# Patient Record
Sex: Female | Born: 1985 | Race: White | Hispanic: No | Marital: Single | Smoking: Never smoker
Health system: Southern US, Community
[De-identification: ages and names within clinical notes are randomized; demographics above are authoritative.]

## PROBLEM LIST (undated history)

## (undated) DIAGNOSIS — R55 Syncope and collapse: Secondary | ICD-10-CM

## (undated) DIAGNOSIS — Z8619 Personal history of other infectious and parasitic diseases: Secondary | ICD-10-CM

## (undated) DIAGNOSIS — Q825 Congenital non-neoplastic nevus: Secondary | ICD-10-CM

## (undated) DIAGNOSIS — Z8679 Personal history of other diseases of the circulatory system: Secondary | ICD-10-CM

## (undated) HISTORY — DX: Congenital non-neoplastic nevus: Q82.5

## (undated) HISTORY — PX: OTHER SURGICAL HISTORY: SHX169

## (undated) HISTORY — DX: Personal history of other diseases of the circulatory system: Z86.79

## (undated) HISTORY — DX: Syncope and collapse: R55

## (undated) HISTORY — DX: Personal history of other infectious and parasitic diseases: Z86.19

---

## 2012-07-05 ENCOUNTER — Ambulatory Visit: Payer: Self-pay | Admitting: Orthopedic Surgery

## 2012-07-26 ENCOUNTER — Encounter: Payer: Self-pay | Admitting: Neurology

## 2012-07-26 ENCOUNTER — Ambulatory Visit (INDEPENDENT_AMBULATORY_CARE_PROVIDER_SITE_OTHER): Payer: 59 | Admitting: Neurology

## 2012-07-26 VITALS — BP 95/65 | HR 70 | Ht 63.0 in | Wt 137.0 lb

## 2012-07-26 DIAGNOSIS — R209 Unspecified disturbances of skin sensation: Secondary | ICD-10-CM

## 2012-07-26 DIAGNOSIS — R200 Anesthesia of skin: Secondary | ICD-10-CM | POA: Insufficient documentation

## 2012-07-26 DIAGNOSIS — M545 Low back pain, unspecified: Secondary | ICD-10-CM | POA: Insufficient documentation

## 2012-07-26 DIAGNOSIS — M25559 Pain in unspecified hip: Secondary | ICD-10-CM

## 2012-07-26 NOTE — Progress Notes (Signed)
Maureen Campbell is a 27 years old right-handed Caucasian female, alone at today's visit, she is referred by her primary care physician at [place] for evaluation of low back pain, bilateral hip pain  Her low back pain started 3 years ago, without clear trigger, she noticed bilateral SI joint pain, getting worse  with prolonged standing, she also noticed bilateral hip pain, radiating towards lateral part of her thighs and occasionally to her right calf,  Her hip, and thigh pain getting worse by bearing weight, prolonged walking, she denies any lower extremity weakness, no sensory changes, she denied bowel and bladder incontinence  Over the years, she was seen by different specialists, MRI of lumbar showed mild degenerative disc disease, x-ray of bilateral hip showed no significant bony abnormalities, she has tried physical therapy, chiropractor without help  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   Constitutional:   N/A Cardiovascular:  Swelling in her legs Ear/Nose/Throat:  N/A Skin: N/A Eyes: N/A Respiratory: Shortness of breath Gastroitestinal: N/A    Hematology/Lymphatic:  N/A Endocrine:  N/A Musculoskeletal: Joints pain, joint swelling, aching muscles. Allergy/Immunology: N/A Neurological: Headaches, numbness. Psychiatric:    N/A  PHYSICAL EXAMINATOINS:  Generalized: In no acute distress  Neck: Supple, no carotid bruits   Cardiac: Regular rate rhythm  Pulmonary: Clear to auscultation bilaterally  Musculoskeletal: No deformity  Neurological examination  Mentation: Alert oriented to time,  place, history taking, and causual conversation  Cranial nerve II-XII: Pupils were equal round reactive to light extraocular movements were full, visual field were full on confrontational test. facial sensation and strength were normal. 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 Uvula tongue midline.  head turning and shoulder shrug and were normal and symmetric.Tongue protrusion into cheek strength was normal.  Motor: normal tone, bulk and strength.  Sensory: Intact to fine touch, pinprick, preserved vibratory sensation, and proprioception at toes.  Coordination: Normal finger to nose, heel-to-shin bilaterally there was no truncal ataxia  Gait: Rising up from seated position without assistance, normal stance, without trunk ataxia, moderate stride, good arm swing, smooth turning, able to perform tiptoe, and heel walking without difficulty.   Romberg signs: Negative  Deep tendon reflexes: Brachioradialis 2/2, biceps 2/2, triceps 2/2, patellar 2/2, Achilles 2/2, plantar responses were flexor bilaterally.  Assessment and plan:  27 years old right-handed Caucasian female, with several years history of low back pain, bilateral hip pain, most consistent with musculoskeletal etiology, normal neurological examination,  1 I have suggested her back stretching exercise, hot compression,  2, after discussion, we decided not to proceed with more imaging study, she will call office for worsening symptoms,  3. She may take Tylenol, NSAIDs as needed

## 2014-03-22 IMAGING — CR DG LUMBAR SPINE 2-3V
1 series · 3 of 3 positions shown · non-contrast
Comparison: none

REASON FOR EXAM: back pain, STANDING
COMMENTS:

PROCEDURE:     DXR - DXR LUMBAR SPINE AP AND LATERAL  - July 05, 2012 [DATE]
RESULT:     Lumbar spine images shows the vertebral body heights and
intervertebral disc spaces appear to be maintained. There is no subluxation.
There is no bony destruction or fracture.

[Series 1: w lumbar spine ap · 0.14mm/px · 3 of 3 slices shown]
[im 1/3]
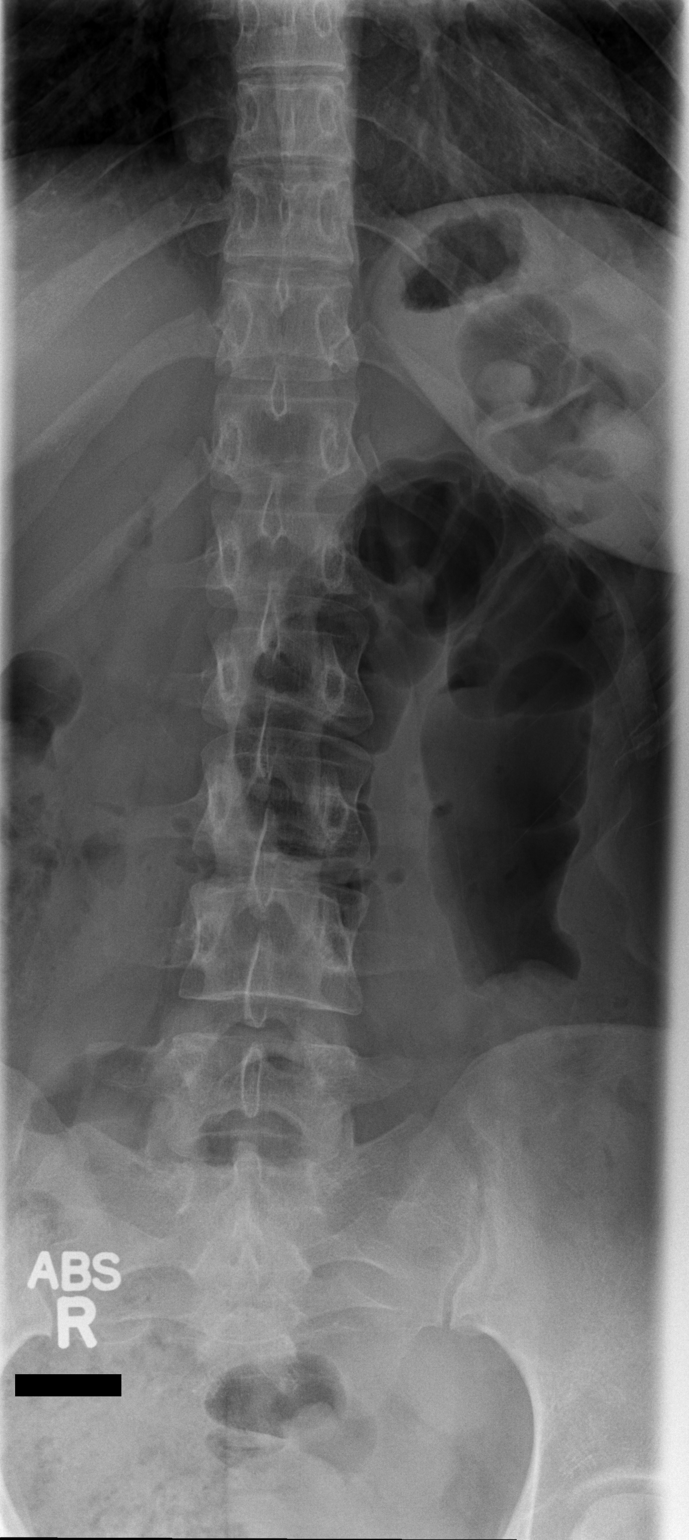
[im 2/3]
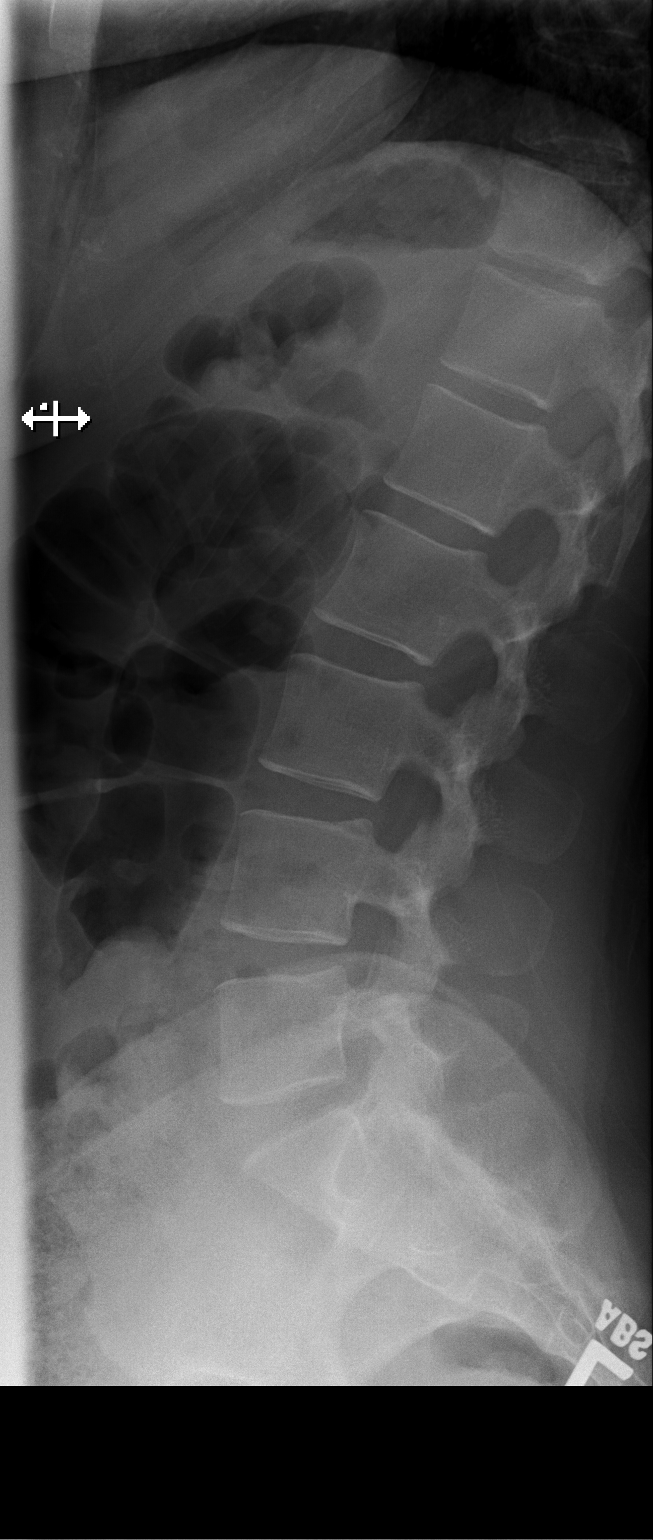
[im 3/3]
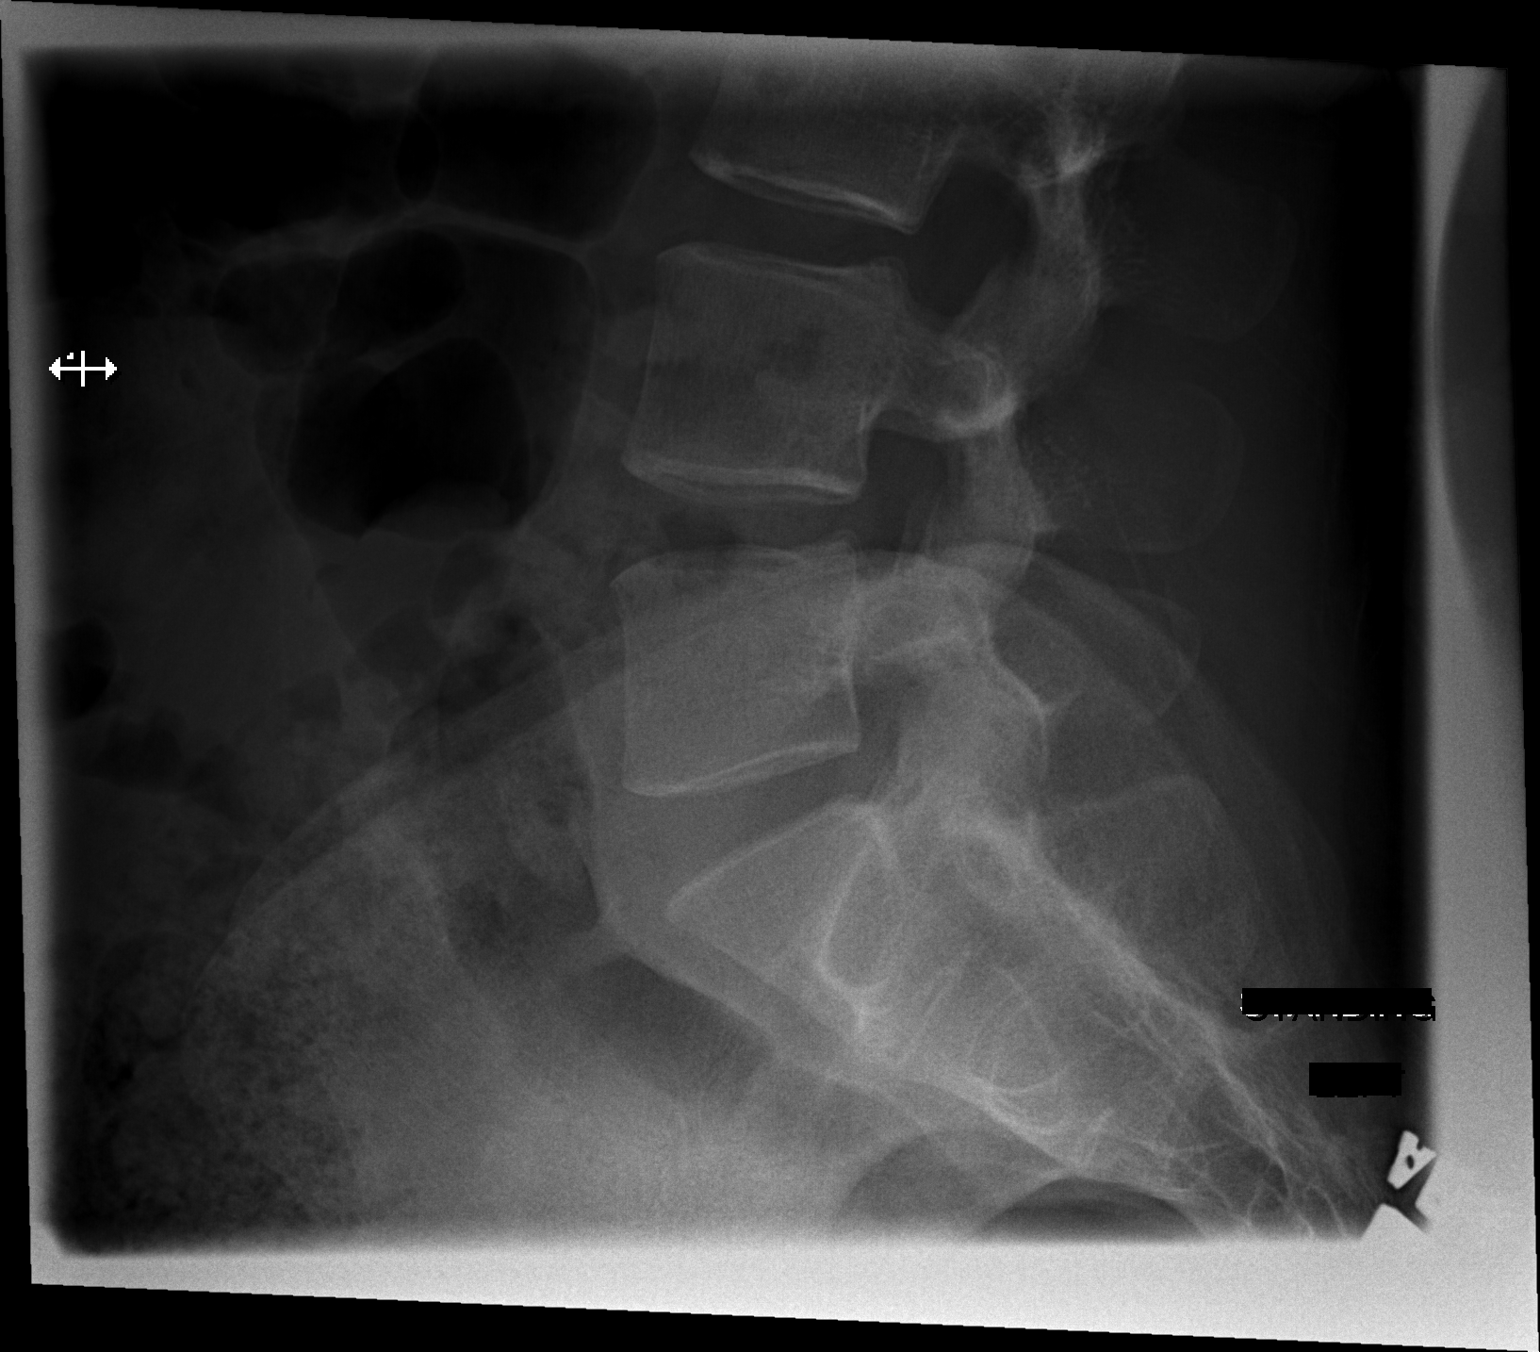

[3 of 3 positions shown; findings below may reference images not displayed]

IMPRESSION: 1. No acute bony abnormality of the lumbar spine. No significant
degenerative change.

[REDACTED]

## 2015-08-06 ENCOUNTER — Ambulatory Visit (INDEPENDENT_AMBULATORY_CARE_PROVIDER_SITE_OTHER): Payer: BLUE CROSS/BLUE SHIELD | Admitting: Internal Medicine

## 2015-08-06 ENCOUNTER — Encounter: Payer: Self-pay | Admitting: *Deleted

## 2015-08-06 ENCOUNTER — Encounter: Payer: Self-pay | Admitting: Internal Medicine

## 2015-08-06 VITALS — BP 108/70 | HR 82 | Temp 98.9°F | Ht 63.0 in | Wt 145.2 lb

## 2015-08-06 DIAGNOSIS — R1011 Right upper quadrant pain: Secondary | ICD-10-CM

## 2015-08-06 NOTE — Progress Notes (Signed)
Pre visit review using our clinic review tool, if applicable. No additional management support is needed unless otherwise documented below in the visit note. 

## 2015-08-06 NOTE — Progress Notes (Signed)
HPI  Pt presents to the clinic today to establish care. She has not had a PCP in many years.  She is concerned about right upper quadrant pain. This started 2 weeks ago, after she walked into a table while at work. There was no bruising, but it was very painful. The pain diminished, but returned 2 days ago and is more severe. She describes the pain as sore and achy. She denies nausea, vomiting, diarrhea, constipation or blood in her stool. She has not tried anything OTC.  Flu: 2014 Tetanus: < 5 years ago Dentist: as needed  Past Medical History  Diagnosis Date  . History of chickenpox   . History of headache   . History of cardiac murmur     No current outpatient prescriptions on file.   No current facility-administered medications for this visit.    No Known Allergies  Family History  Problem Relation Age of Onset  . Diabetes Father   . Thyroid disease Mother   . Arthritis Paternal Grandmother   . Heart disease Paternal Grandfather   . Stroke Paternal Grandfather   . Hypertension Paternal Grandmother   . Diabetes Paternal Grandfather     Social History   Social History  . Marital Status: Single    Spouse Name: Eliberto Ivory  . Number of Children: o  . Years of Education: college   Occupational History  .  [employer]   Social History Main Topics  . Smoking status: Never Smoker   . Smokeless tobacco: Never Used  . Alcohol Use: No  . Drug Use: No  . Sexual Activity: Not on file   Other Topics Concern  . Not on file   Social History Narrative   She lives with her husband, who recently moved from [place] and [place], she denies smoke, no drinking, she works at [employer], no children    ROS:  Constitutional: Denies fever, malaise, fatigue, headache or abrupt weight changes.  Respiratory: Denies difficulty breathing, shortness of breath, cough or sputum production.   Cardiovascular: Denies chest pain, chest tightness, palpitations or swelling in the hands  or feet.  Gastrointestinal: Pt reports RUQ pain. Denies bloating, constipation, diarrhea or blood in the stool.  GU: Denies frequency, urgency, pain with urination, blood in urine, odor or discharge. Musculoskeletal: Denies decrease in range of motion, difficulty with gait, muscle pain or joint pain and swelling.  Skin: Denies redness, rashes, lesions or ulcercations.    No other specific complaints in a complete review of systems (except as listed in HPI above).  PE:  BP 108/70 mmHg  Pulse 82  Temp(Src) 98.9 F (37.2 C) (Oral)  Ht  (1.6 m)  Wt 145 lb 4 oz (65.885 kg)  BMI 25.74 kg/m2  SpO2 97%  LMP 08/06/2015 Wt Readings from Last 3 Encounters:  08/06/15 145 lb 4 oz (65.885 kg)  07/26/12 137 lb (62.143 kg)    General: Appears her stated age, in NAD. Cardiovascular: Normal rate and rhythm. S1,S2 noted.  No murmur, rubs or gallops noted.  Pulmonary/Chest: Normal effort and positive vesicular breath sounds. No respiratory distress. No wheezes, rales or ronchi noted.  Abdomen: Soft and nontender. Normal bowel sounds. No distention or masses noted. Liver, spleen and kidneys non palpable. Musculoskeletal: Tender to palpation over the right lower ribs anterolaterally. Neurological: Alert and oriented.  Psychiatric: Mood and affect normal. Behavior is normal. Judgment and thought content normal.    Assessment and Plan:  RUQ pain:  Likely rib fracture, given recent  trauma No indication for xray of ribs at this time Ibuprofen and heat Will check CBC and CMET today just to make sure no injury to liver  Will follow up after labs, make an appt for an annual exam

## 2015-08-06 NOTE — Patient Instructions (Signed)

## 2015-08-07 LAB — CBC
HCT: 38.8 % (ref 36.0–46.0)
Hemoglobin: 13 g/dL (ref 12.0–15.0)
MCHC: 33.4 g/dL (ref 30.0–36.0)
MCV: 85.8 fl (ref 78.0–100.0)
PLATELETS: 241 10*3/uL (ref 150.0–400.0)
RBC: 4.53 Mil/uL (ref 3.87–5.11)
RDW: 13.5 % (ref 11.5–15.5)
WBC: 9.3 10*3/uL (ref 4.0–10.5)

## 2015-08-07 LAB — COMPREHENSIVE METABOLIC PANEL
ALT: 13 U/L (ref 0–35)
AST: 16 U/L (ref 0–37)
Albumin: 4.3 g/dL (ref 3.5–5.2)
Alkaline Phosphatase: 68 U/L (ref 39–117)
BUN: 12 mg/dL (ref 6–23)
CHLORIDE: 102 meq/L (ref 96–112)
CO2: 32 mEq/L (ref 19–32)
CREATININE: 0.68 mg/dL (ref 0.40–1.20)
Calcium: 9.6 mg/dL (ref 8.4–10.5)
GFR: 109.4 mL/min (ref >60.00)
GLUCOSE: 99 mg/dL (ref 70–99)
Potassium: 3.7 mEq/L (ref 3.5–5.1)
SODIUM: 139 meq/L (ref 135–145)
Total Bilirubin: 0.4 mg/dL (ref 0.2–1.2)
Total Protein: 7.4 g/dL (ref 6.0–8.3)

## 2015-08-13 ENCOUNTER — Telehealth: Payer: Self-pay | Admitting: Internal Medicine

## 2015-08-13 NOTE — Telephone Encounter (Signed)
Patient Name: Renee HarderKAYLIN Campau  DOB: 1985/07/17    Initial Comment Caller states she was seen last week and they thought she had a broken rib. The pain has moved to her back and is getting worse.    Nurse Assessment  Nurse: Stefano GaulStringer, RN, Dwana CurdVera Date/Time (Eastern Time): 08/13/2015 1:56:58 PM  Confirm and document reason for call. If symptomatic, describe symptoms. You must click the next button to save text entered. ---Caller states she was seen last week at the office and they thought she had a broken rib. She works in [employer] and slid. Pain is radiating to her back. Pain is getting worse. She is taking OTC ibuprofen and using heat. Pain level 6-8. Pain is bringing her to tears.  Has the patient traveled out of the country within the last 30 days? ---Not Applicable  Does the patient have any new or worsening symptoms? ---Yes  Will a triage be completed? ---Yes  Related visit to physician within the last 2 weeks? ---Yes  Does the PT have any chronic conditions? (i.e. diabetes, asthma, etc.) ---Yes  List chronic conditions. ---herniated disc  Is the patient pregnant or possibly pregnant? (Ask all females between the ages of 8912-55) ---No  Is this a behavioral health or substance abuse call? ---No     Guidelines    Guideline Title Affirmed Question Affirmed Notes  Flank Pain [1] SEVERE pain (e.g., excruciating, scale 8-10) AND [2] present > 1 hour    Final Disposition User   Go to ED Now Stefano GaulStringer, RN, Dwana CurdVera    Comments  Pt states she does not want to go to the ER due to medical bills she already owes but wants to come to the office to be seen.  [facility] and [facility] and advised her that pt was seen last week by Nicki Reaperegina Baity and was diagnosed with a broken rib and was advised to use OTC ibuprofen and heat. Pain has gotten worse with pain level of 8 and she was advised to go to the ER but states she has too many medical bills and does not want to go to the ER but would prefer to come  to the office.  Please call her on secondary number if primary number is not answered.   Referrals  GO TO FACILITY REFUSED   Disagree/Comply: Disagree  Disagree/Comply Reason: Disagree with instructions

## 2015-08-13 NOTE — Telephone Encounter (Signed)
Noted and agree. Will likely need chest xray.

## 2015-08-13 NOTE — Telephone Encounter (Signed)
I called pt and she is not hurting as bad as when she spoke with TH; No SOB and no difficulty breathing. [facility]; spoke with Almira CoasterGina RN team lead and advised to schedule appt at our office but if pt condition changes or worsens prior to appt pt will go to ED. Pt voiced understanding. Pt scheduled appt with Mayra ReelKate Clark NP on 08/14/15 at 1:15.

## 2015-08-14 ENCOUNTER — Ambulatory Visit (INDEPENDENT_AMBULATORY_CARE_PROVIDER_SITE_OTHER): Payer: BLUE CROSS/BLUE SHIELD | Admitting: Primary Care

## 2015-08-14 ENCOUNTER — Ambulatory Visit: Payer: BLUE CROSS/BLUE SHIELD | Admitting: Primary Care

## 2015-08-14 ENCOUNTER — Ambulatory Visit
Admission: RE | Admit: 2015-08-14 | Discharge: 2015-08-14 | Disposition: A | Discharge status: Home / Self Care | Payer: BLUE CROSS/BLUE SHIELD | Source: Ambulatory Visit | Attending: Primary Care | Admitting: Primary Care

## 2015-08-14 ENCOUNTER — Ambulatory Visit (INDEPENDENT_AMBULATORY_CARE_PROVIDER_SITE_OTHER)
Admission: RE | Admit: 2015-08-14 | Discharge: 2015-08-14 | Disposition: A | Discharge status: Home / Self Care | Payer: BLUE CROSS/BLUE SHIELD | Source: Ambulatory Visit | Attending: Primary Care | Admitting: Primary Care

## 2015-08-14 ENCOUNTER — Encounter: Payer: Self-pay | Admitting: Primary Care

## 2015-08-14 VITALS — BP 110/68 | HR 71 | Temp 98.2°F | Ht 63.0 in | Wt 146.8 lb

## 2015-08-14 DIAGNOSIS — R0781 Pleurodynia: Secondary | ICD-10-CM

## 2015-08-14 MED ORDER — NAPROXEN 500 MG PO TABS: 500.0000 mg | ORAL_TABLET | Freq: Two times a day (BID) | ORAL | Status: AC | PRN

## 2015-08-14 NOTE — Addendum Note (Signed)
Addended by: Alvina ChouWALSH, TERRI J on: 08/14/2015 04:23 PM   Modules accepted: Orders

## 2015-08-14 NOTE — Patient Instructions (Addendum)
Start Naproxen tablets for pain. Take 1 tablet by mouth twice daily as needed for moderate-severe pain.  If your rib is cracked, it may take several months to heal completely. You should notice a gradual improvement as time passes.  Ensure you are completing deep breathing exercises to keep your lungs inflated.  Complete xray(s) prior to leaving today. I will notify you of your results once received.  Please don't hesitate to call us with any questions.  It was a pleasure meeting you!  Rib Fracture A rib fracture is a break or crack in one of the bones of the ribs. The ribs are a group of long, curved bones that wrap around your chest and attach to your spine. They protect your lungs and other organs in the chest cavity. A broken or cracked rib is often painful, but most do not cause other problems. Most rib fractures heal on their own over time. However, rib fractures can be more serious if multiple ribs are broken or if broken ribs move out of place and push against other structures. CAUSES   A direct blow to the chest. For example, this could happen during contact sports, a car accident, or a fall against a hard object.  Repetitive movements with high force, such as pitching a baseball or having severe coughing spells. SYMPTOMS   Pain when you breathe in or cough.  Pain when someone presses on the injured area. DIAGNOSIS  Your caregiver will perform a physical exam. Various imaging tests may be ordered to confirm the diagnosis and to look for related injuries. These tests may include a chest X-ray, computed tomography (CT), magnetic resonance imaging (MRI), or a bone scan. TREATMENT  Rib fractures usually heal on their own in 1-3 months. The longer healing period is often associated with a continued cough or other aggravating activities. During the healing period, pain control is very important. Medication is usually given to control pain. Hospitalization or surgery may be needed for  more severe injuries, such as those in which multiple ribs are broken or the ribs have moved out of place.  HOME CARE INSTRUCTIONS   Avoid strenuous activity and any activities or movements that cause pain. Be careful during activities and avoid bumping the injured rib.  Gradually increase activity as directed by your caregiver.  Only take over-the-counter or prescription medications as directed by your caregiver. Do not take other medications without asking your caregiver first.  Apply ice to the injured area for the first 1-2 days after you have been treated or as directed by your caregiver. Applying ice helps to reduce inflammation and pain.  Put ice in a plastic bag.  Place a towel between your skin and the bag.   Leave the ice on for 15-20 minutes at a time, every 2 hours while you are awake.  Perform deep breathing as directed by your caregiver. This will help prevent pneumonia, which is a common complication of a broken rib. Your caregiver may instruct you to:  Take deep breaths several times a day.  Try to cough several times a day, holding a pillow against the injured area.  Use a device called an incentive spirometer to practice deep breathing several times a day.  Drink enough fluids to keep your urine clear or pale yellow. This will help you avoid constipation.   Do not wear a rib belt or binder. These restrict breathing, which can lead to pneumonia.  SEEK IMMEDIATE MEDICAL CARE IF:   You have a  fever.   You have difficulty breathing or shortness of breath.   You develop a continual cough, or you cough up thick or bloody sputum.  You feel sick to your stomach (nausea), throw up (vomit), or have abdominal pain.   You have worsening pain not controlled with medications.  MAKE SURE YOU:  Understand these instructions.  Will watch your condition.  Will get help right away if you are not doing well or get worse.   This information is not intended to  replace advice given to you by your health care provider. Make sure you discuss any questions you have with your health care provider.   Document Released: 04/12/2005 Document Revised: 12/13/2012 Document Reviewed: 06/14/2012 Elsevier Interactive Patient Education [employer]

## 2015-08-14 NOTE — Progress Notes (Signed)
Pre visit review using our clinic review tool, if applicable. No additional management support is needed unless otherwise documented below in the visit note. 

## 2015-08-14 NOTE — Progress Notes (Signed)
Subjective:    Patient ID: Maureen Campbell, female    DOB: 1985/05/27, 30 y.o.   MRN: [id number]  HPI  [name] is a 30 year old female who presents today with a chief complaint of rib pain. Her pain is located to the right lateral chest wall with radiation of pain to her right flank. About 3 weeks ago she accidentally hit her right side on a table at work (works at [employer]).   Overall her pain is improved, but yesterday she experienced a moderate/severe amount of pain during work while ambulating.  Denies abdominal pain, urinary frequency, difficulty urinating, dysuria, vomiting, shortness of breath. She's been taking ibuprofen 600 mg for the past 2 days with improvement yesterday, but not today. She's rested 1 day since the incident and felt better after resting.  Review of Systems  Respiratory: Negative for shortness of breath.   Cardiovascular: Negative for chest pain.  Gastrointestinal: Negative for abdominal pain.  Genitourinary: Negative for dysuria, frequency and difficulty urinating.  Musculoskeletal:       Right rib pain       Past Medical History  Diagnosis Date  . History of chickenpox   . History of cardiac murmur   . 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 Vasomotor instability      Social History   Social History  . Marital Status: Single    Spouse Name: Eliberto Ivory  . Number of Children: o  . Years of Education: college   Occupational History  .  [employer]   Social History Main Topics  . Smoking status: Never Smoker   . Smokeless tobacco: Never Used  . Alcohol Use: No  . Drug Use: No  . Sexual Activity: Yes   Other Topics Concern  . Not on file   Social History Narrative   She lives with her husband, who recently moved from [place] and [place], she denies smoke, no drinking, she works at [employer], no children    Past Surgical History  Procedure Laterality Date  . Laser birth mark      age 33    Family  History  Problem Relation Age of Onset  . Diabetes Father   . Thyroid disease Mother   . Arthritis Paternal Grandmother   . Heart disease Paternal Grandfather   . Stroke Paternal Grandfather   . Hypertension Paternal Grandmother   . Diabetes Paternal Grandfather     No Known Allergies  No current outpatient prescriptions on file prior to visit.   No current facility-administered medications on file prior to visit.    BP 110/68 mmHg  Pulse 71  Temp(Src) 98.2 F (36.8 C) (Oral)  Ht  (1.6 m)  Wt 146 lb 12.8 oz (66.588 kg)  BMI 26.01 kg/m2  SpO2 97%  LMP 08/06/2015    Objective:   Physical Exam  Constitutional: She appears well-nourished.  Cardiovascular: Normal rate and regular rhythm.   Pulmonary/Chest: Effort normal and breath sounds normal.  Full expansion of lungs bilaterally. No evidence of pneumothorax.  Musculoskeletal:  Moderate tenderness to right lateral chest wall at about T8. No crepitus.   Skin: Skin is warm and dry. No erythema.  No bruising.          Assessment & Plan:  Rib Pain:  Located to the right lateral chest wall since injury at work 3 weeks ago. Moderately tender upon exam at T8 level. No evidence of pneumothorax. Suspect  fracture/crack to right rib. Will obtain xray today to rule in/out. Discussed if fracture that it takes 1-3 months for complete healing. RX for Naproxen sent to pharmacy PRN pain. Discussed importance of deep breathing exercises. Return precautions provided.

## 2016-02-09 ENCOUNTER — Encounter: Payer: Self-pay | Admitting: Family Medicine

## 2016-02-09 ENCOUNTER — Ambulatory Visit (INDEPENDENT_AMBULATORY_CARE_PROVIDER_SITE_OTHER): Payer: BLUE CROSS/BLUE SHIELD | Admitting: Internal Medicine

## 2016-02-09 VITALS — BP 102/74 | HR 73 | Temp 98.6°F | Ht 63.0 in | Wt 143.5 lb

## 2016-02-09 DIAGNOSIS — R3915 Urgency of urination: Secondary | ICD-10-CM

## 2016-02-09 LAB — POC URINALSYSI DIPSTICK (AUTOMATED)
Blood, UA: NEGATIVE
GLUCOSE UA: NEGATIVE
Ketones, UA: NEGATIVE
LEUKOCYTES UA: NEGATIVE
NITRITE UA: POSITIVE
PH UA: 6
Protein, UA: NEGATIVE
Spec Grav, UA: >=1.03
Urobilinogen, UA: 1

## 2016-02-09 LAB — POCT URINE PREGNANCY: Preg Test, Ur: NEGATIVE

## 2016-02-09 NOTE — Progress Notes (Signed)
HPI  Pt presents to the clinic today with c/o urinary frequency. She reports this started 2 days ago. She denies urgency, dysuria or blood in her urine. She denies fever, chills, nausea or low back pain. She denies vaginal complaints. She has taken AZO OTC with some relief. She reports she has been drinking more sodas than usual.   Review of Systems  Past Medical History:  Diagnosis Date  . 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 History of cardiac murmur   . History of chickenpox   . Vasomotor instability     Family History  Problem Relation Age of Onset  . Diabetes Father   . Thyroid disease Mother   . Arthritis Paternal Grandmother   . Heart disease Paternal Grandfather   . Stroke Paternal Grandfather   . Hypertension Paternal Grandmother   . Diabetes Paternal Grandfather     Social History   Social History  . Marital status: Single    Spouse name: Eliberto Ivoryustin  . Number of children: o  . Years of education: college   Occupational History  .  [employer]   Social History Main Topics  . Smoking status: Never Smoker  . Smokeless tobacco: Never Used  . Alcohol use No  . Drug use: No  . Sexual activity: Yes   Other Topics Concern  . Not on file   Social History Narrative   She lives [place] her [place], [place], she denies smoke, no drinking, she works at [employer], no children    No Known Allergies  Constitutional: Denies fever, malaise, fatigue, headache or abrupt weight changes.   GU: Pt reports urgency. Denies frequency, dysuria, burning sensation, blood in urine, odor or discharge. Skin: Denies redness, rashes, lesions or ulcercations.   No other specific complaints in a complete review of systems (except as listed in HPI above).    Objective:   Physical Exam  BP 102/74   Pulse 73   Temp 98.6 F (37 C) (Oral)   Ht 5\' 3"  (1.6 m)   Wt 143 lb 8 oz (65.1 kg)   LMP 02/03/2016   SpO2 98%    BMI 25.42 kg/m   Wt Readings from Last 3 Encounters:  02/09/16 143 lb 8 oz (65.1 kg)  08/14/15 146 lb 12.8 oz (66.6 kg)  08/06/15 145 lb 4 oz (65.9 kg)    General: Appears her stated age, well developed, well nourished in NAD. Cardiovascular: Normal rate and rhythm. S1,S2 noted.   Pulmonary/Chest: Normal effort and positive vesicular breath sounds. No respiratory distress. No wheezes, rales or ronchi noted.  Abdomen: Soft and nontender. Normal bowel sounds. No distention or masses noted.  No CVA tenderness.      Assessment & Plan:   Urgency secondary to cystitis:  Urinalysis: AZO interferred Urine Hcg: negative Will send urine culture- will treat if culture positive OK to take AZO OTC Drink plenty of fluids  RTC as needed or if symptoms persist. Nicki ReaperBAITY, REGINA, NP

## 2016-02-09 NOTE — Patient Instructions (Signed)
Interstitial Cystitis Interstitial cystitis is a condition that causes inflammation of the bladder. The bladder is a hollow organ in the lower part of your abdomen. It stores urine after the urine is made by your kidneys. With interstitial cystitis, you may have pain in the bladder area. You may also have a frequent and urgent need to urinate. The severity of interstitial cystitis can vary from person to person. You may have flare-ups of the condition, and then it may go away for a while. For many people who have this condition, it becomes a long-term problem. CAUSES The cause of this condition is not known. RISK FACTORS This condition is more likely to develop in women. SYMPTOMS Symptoms of interstitial cystitis vary, and they can change over time. Symptoms may include:  Discomfort or pain in the bladder area. This can range from mild to severe. The pain may change in intensity as the bladder fills with urine or as it empties.  Pelvic pain.  An urgent need to urinate.  Frequent urination.  Pain during sexual intercourse.  Pinpoint bleeding on the bladder wall. For women, the symptoms often get worse during menstruation. DIAGNOSIS This condition is diagnosed by evaluating your symptoms and ruling out other causes. A physical exam will be done. Various tests may be done to rule out other conditions. Common tests include:  Urine tests.  Cystoscopy. In this test, a tool that is like a very thin telescope is used to look into your bladder.  Biopsy. This involves taking a sample of tissue from the bladder wall to be examined under a microscope. TREATMENT There is no cure for interstitial cystitis, but treatment methods are available to control your symptoms. Work closely with your health care provider to find the treatments that will be most effective for you. Treatment options may include:  Medicines to relieve pain and to help reduce the number of times that you feel the need to  urinate.  Bladder training. This involves learning ways to control when you urinate, such as:  Urinating at scheduled times.  Training yourself to delay urination.  Doing exercises (Kegel exercises) to strengthen the muscles that control urine flow.  Lifestyle changes, such as changing your diet or taking steps to control stress.  Use of a device that provides electrical stimulation in order to reduce pain.  A procedure that stretches your bladder by filling it with air or fluid.  Surgery. This is rare. It is only done for extreme cases if other treatments do not help. HOME CARE INSTRUCTIONS  Take medicines only as directed by your health care provider.  Use bladder training techniques as directed.  Keep a bladder diary to find out which foods, liquids, or activities make your symptoms worse.  Use your bladder diary to schedule bathroom trips. If you are away from home, plan to be near a bathroom at each of your scheduled times.  Make sure you urinate just before you leave the house and just before you go to bed.  Do Kegel exercises as directed by your health care provider.  Do not drink alcohol.  Do not use any tobacco products, including cigarettes, chewing tobacco, or electronic cigarettes. If you need help quitting, ask your health care provider.  Make dietary changes as directed by your health care provider. You may need to avoid spicy foods and foods that contain a high amount of potassium.  Limit your drinking of beverages that stimulate urination. These include soda, coffee, and tea.  Keep all follow-up   visits as directed by your health care provider. This is important. SEEK MEDICAL CARE IF:  Your symptoms do not get better after treatment.  Your pain and discomfort are getting worse.  You have more frequent urges to urinate.  You have a fever. SEEK IMMEDIATE MEDICAL CARE IF:  You are not able to control your bladder at all.   This information is not  intended to replace advice given to you by your health care provider. Make sure you discuss any questions you have with your health care provider.   Document Released: 12/12/2003 Document Revised: 05/03/2014 Document Reviewed: 12/18/2013 [employer] Interactive Patient Education 2016 [employer]  

## 2016-02-09 NOTE — Addendum Note (Signed)
Addended by: Roena MaladyEVONTENNO, Janautica Netzley Y on: 02/09/2016 03:58 PM   Modules accepted: Orders

## 2016-02-09 NOTE — Progress Notes (Signed)
Pre visit review using our clinic review tool, if applicable. No additional management support is needed unless otherwise documented below in the visit note. 

## 2016-02-11 LAB — URINE CULTURE: ORGANISM ID, BACTERIA: NO GROWTH

## 2017-04-30 IMAGING — DX DG CHEST 1V
1 series · 1 of 1 positions shown · non-contrast
Comparison: None.

CLINICAL DATA: Pain following trauma 3 weeks prior

EXAM:
CHEST 1 VIEW

[rib obl]
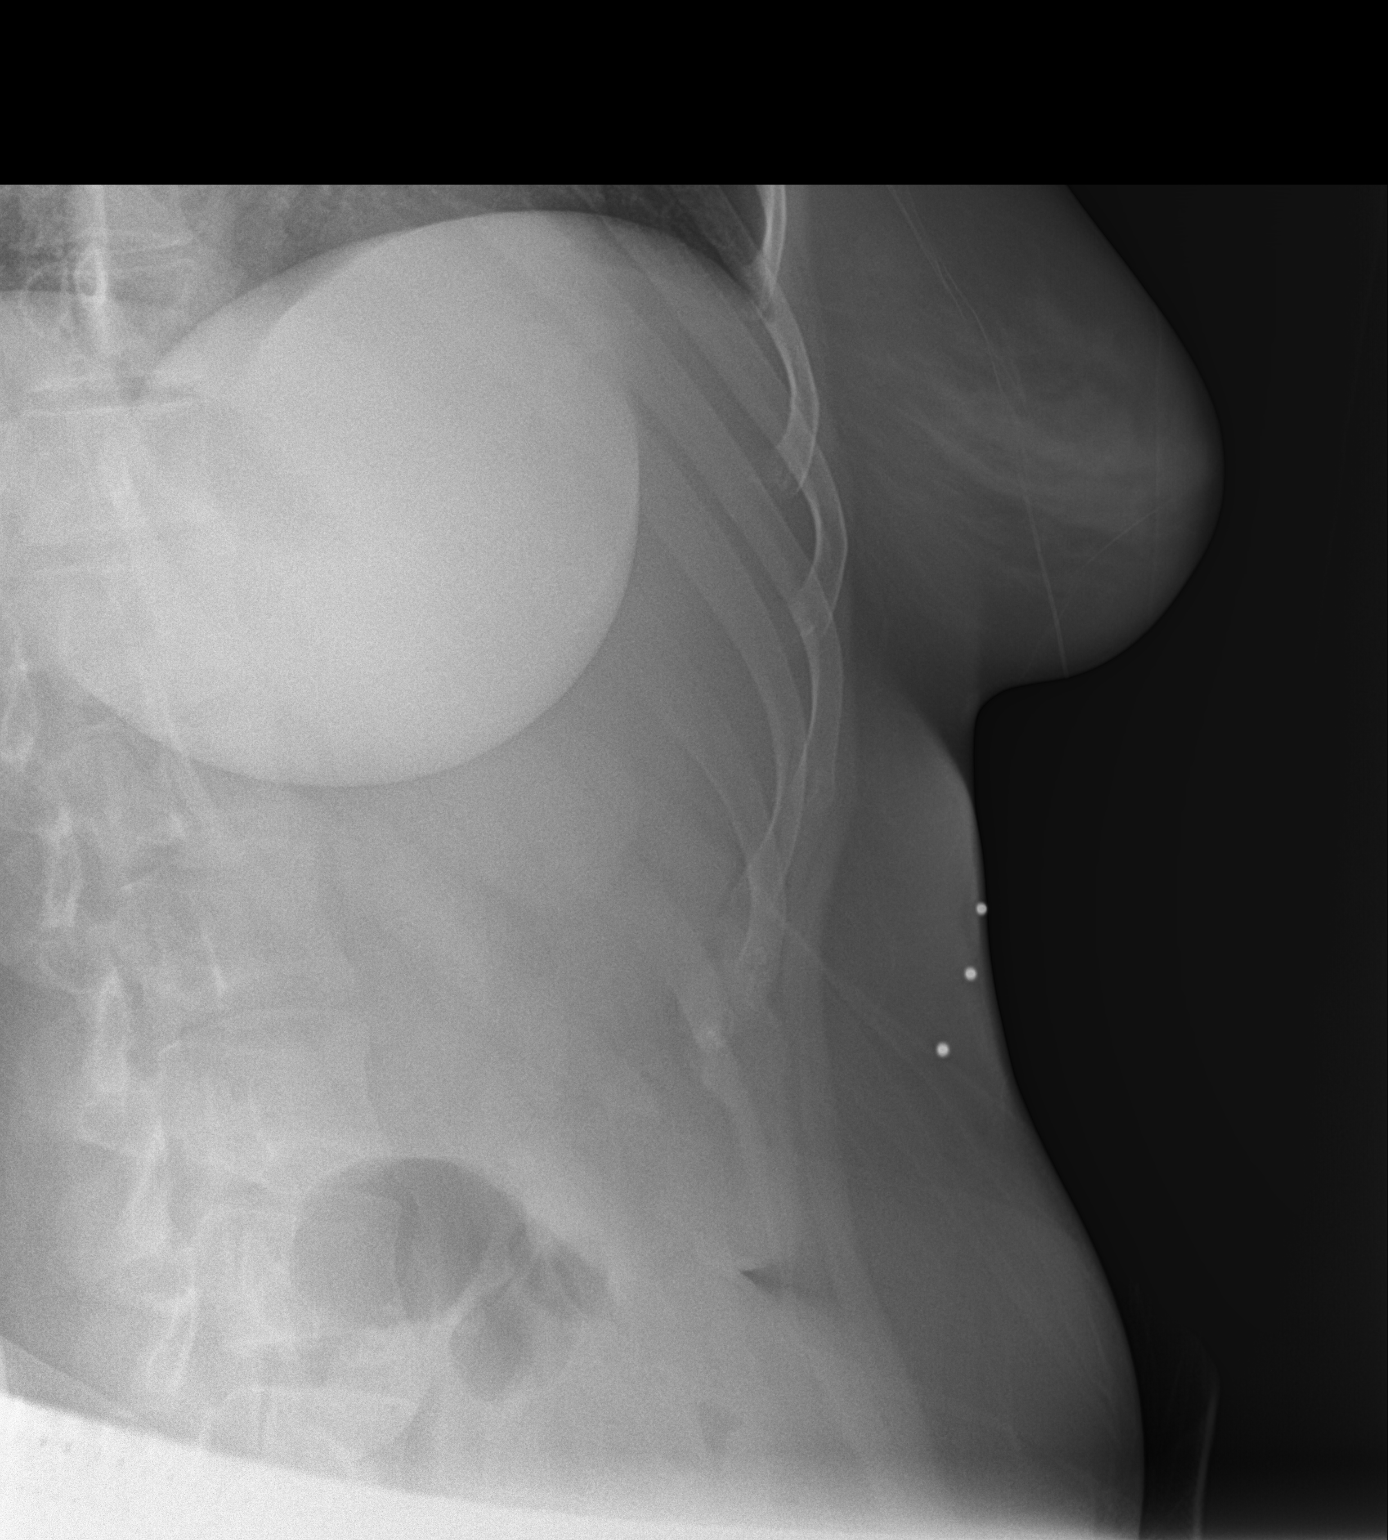

[1 of 1 positions shown; findings below may reference images not displayed]

FINDINGS: Lungs are clear. Heart size and pulmonary vascularity are normal. No
adenopathy. No pneumothorax or effusion. No bone lesions evident.
IMPRESSION: No abnormality noted.

## 2017-04-30 IMAGING — DX DG CHEST 1V
1 series · 1 of 1 positions shown · non-contrast
Comparison: None.

CLINICAL DATA: Pain following trauma 3 weeks prior

EXAM:
CHEST 1 VIEW

[chest pa]
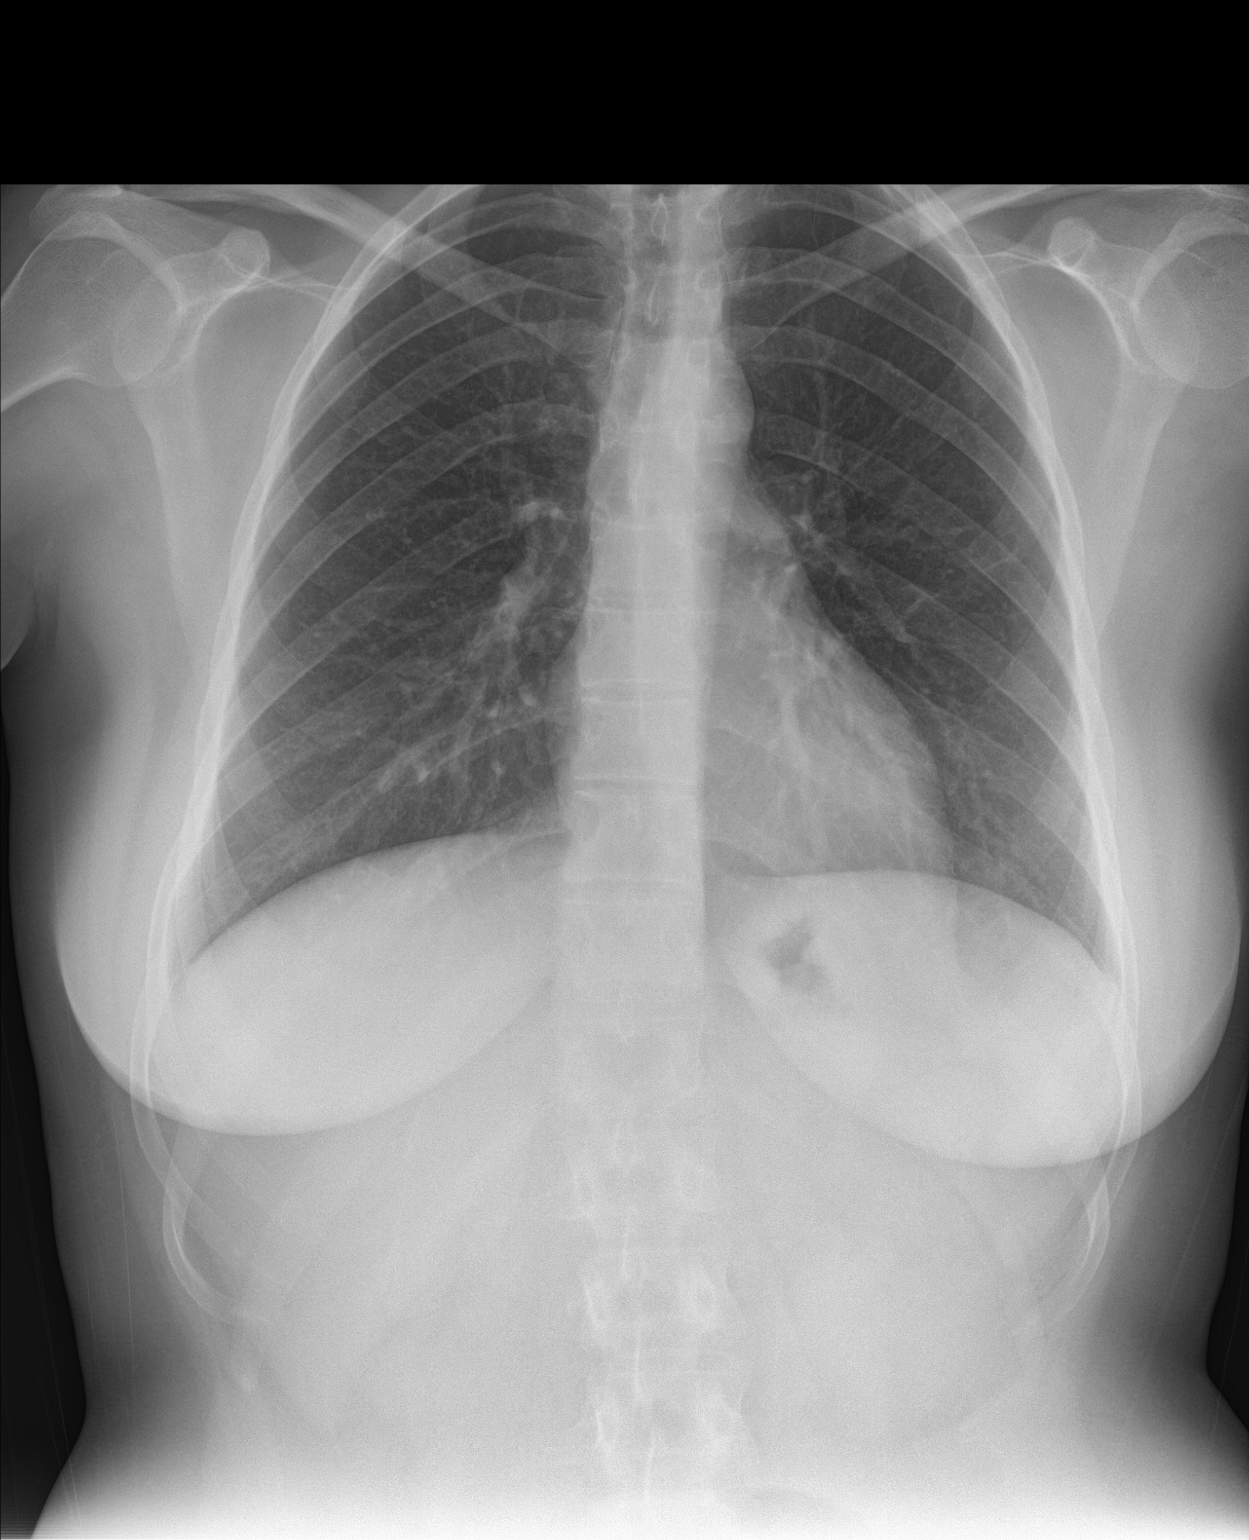

[1 of 1 positions shown; findings below may reference images not displayed]

FINDINGS: Lungs are clear. Heart size and pulmonary vascularity are normal. No
adenopathy. No pneumothorax or effusion. No bone lesions evident.
IMPRESSION: No abnormality noted.
